# Patient Record
Sex: Female | Born: 1980 | Race: White | Hispanic: No | Marital: Single | Smoking: Former smoker
Health system: Southern US, Community
[De-identification: ages and names within clinical notes are randomized; demographics above are authoritative.]

## PROBLEM LIST (undated history)

## (undated) DIAGNOSIS — F909 Attention-deficit hyperactivity disorder, unspecified type: Secondary | ICD-10-CM

## (undated) DIAGNOSIS — D4709 Other mast cell neoplasms of uncertain behavior: Secondary | ICD-10-CM

## (undated) HISTORY — DX: Attention-deficit hyperactivity disorder, unspecified type: F90.9

## (undated) HISTORY — DX: Other mast cell neoplasms of uncertain behavior: D47.09

## (undated) HISTORY — PX: NO PAST SURGERIES: SHX2092

---

## 2001-03-31 ENCOUNTER — Other Ambulatory Visit: Admission: RE | Admit: 2001-03-31 | Discharge: 2001-03-31 | Payer: Self-pay | Admitting: Gynecology

## 2004-01-16 ENCOUNTER — Other Ambulatory Visit: Admission: RE | Admit: 2004-01-16 | Discharge: 2004-01-16 | Payer: Self-pay | Admitting: Gynecology

## 2007-02-03 ENCOUNTER — Other Ambulatory Visit: Admission: RE | Admit: 2007-02-03 | Discharge: 2007-02-03 | Payer: Self-pay | Admitting: Gynecology

## 2015-08-10 LAB — OB RESULTS CONSOLE HIV ANTIBODY (ROUTINE TESTING): HIV: NONREACTIVE

## 2015-08-10 LAB — OB RESULTS CONSOLE ABO/RH: RH Type: POSITIVE

## 2015-08-10 LAB — OB RESULTS CONSOLE RUBELLA ANTIBODY, IGM: Rubella: IMMUNE

## 2015-08-10 LAB — OB RESULTS CONSOLE ANTIBODY SCREEN: Antibody Screen: NEGATIVE

## 2015-08-10 LAB — OB RESULTS CONSOLE RPR: RPR: NONREACTIVE

## 2015-08-10 LAB — OB RESULTS CONSOLE HEPATITIS B SURFACE ANTIGEN: HEP B S AG: NEGATIVE

## 2015-08-10 LAB — OB RESULTS CONSOLE GC/CHLAMYDIA
CHLAMYDIA, DNA PROBE: NEGATIVE
GC PROBE AMP, GENITAL: NEGATIVE

## 2015-10-01 NOTE — L&D Delivery Note (Addendum)
Pt pushed for two hours then labored down for 60mins and begun pushing again. Pt pushed for about 15-20 mins to deliver a viable female infant in OA position over 1st degree mediolateral perineal laceration and left periurethral abrasion. Nuchal x 1 reduced over infants head at perineum - tight.  Anterior and posterior shoulders spontaneously delivered with next two pushes; body easily followed next. Infant placed on mothers abdomen and bulb suction of mouth and nose performed. Cord was then clamped and cut by supportive friend. Cord blood obtained for banking. Baby had a vigorous spontaneous cry noted. Placenta then delivered about 5 mins later intact, 3VC shultz. Fundal massage performed and pitocin per protocol. Fundus firm. First degree lac repaired with 2-0 vicryl suture. Great hemostatic and cosmetic results. Abrasion did not need repair.  Mother and baby stable. Counts correct. Apgars 9 and 9

## 2015-12-26 ENCOUNTER — Inpatient Hospital Stay (HOSPITAL_COMMUNITY): Admission: AD | Admit: 2015-12-26 | Payer: Self-pay | Source: Ambulatory Visit | Admitting: Obstetrics and Gynecology

## 2016-01-18 LAB — OB RESULTS CONSOLE GBS: STREP GROUP B AG: POSITIVE

## 2016-02-12 ENCOUNTER — Telehealth (HOSPITAL_COMMUNITY): Payer: Self-pay | Admitting: *Deleted

## 2016-02-12 ENCOUNTER — Encounter (HOSPITAL_COMMUNITY): Payer: Self-pay | Admitting: *Deleted

## 2016-02-12 NOTE — Telephone Encounter (Signed)
Preadmission screen  

## 2016-02-16 ENCOUNTER — Encounter (HOSPITAL_COMMUNITY): Payer: Self-pay | Admitting: *Deleted

## 2016-02-16 ENCOUNTER — Telehealth (HOSPITAL_COMMUNITY): Payer: Self-pay | Admitting: *Deleted

## 2016-02-16 NOTE — Telephone Encounter (Signed)
Preadmission screen  

## 2016-02-20 ENCOUNTER — Inpatient Hospital Stay (HOSPITAL_COMMUNITY): Payer: BC Managed Care – PPO | Admitting: Anesthesiology

## 2016-02-20 ENCOUNTER — Encounter (HOSPITAL_COMMUNITY): Payer: Self-pay

## 2016-02-20 ENCOUNTER — Inpatient Hospital Stay (HOSPITAL_COMMUNITY)
Admission: RE | Admit: 2016-02-20 | Discharge: 2016-02-23 | DRG: 775 | Disposition: A | Discharge status: Home / Self Care | Payer: BC Managed Care – PPO | Source: Ambulatory Visit | Attending: Obstetrics and Gynecology | Admitting: Obstetrics and Gynecology

## 2016-02-20 DIAGNOSIS — Z87891 Personal history of nicotine dependence: Secondary | ICD-10-CM | POA: Diagnosis not present

## 2016-02-20 DIAGNOSIS — F909 Attention-deficit hyperactivity disorder, unspecified type: Secondary | ICD-10-CM | POA: Diagnosis present

## 2016-02-20 DIAGNOSIS — O480 Post-term pregnancy: Secondary | ICD-10-CM | POA: Diagnosis present

## 2016-02-20 DIAGNOSIS — Z3A40 40 weeks gestation of pregnancy: Secondary | ICD-10-CM

## 2016-02-20 DIAGNOSIS — Z8261 Family history of arthritis: Secondary | ICD-10-CM | POA: Diagnosis not present

## 2016-02-20 DIAGNOSIS — O99824 Streptococcus B carrier state complicating childbirth: Secondary | ICD-10-CM | POA: Diagnosis present

## 2016-02-20 DIAGNOSIS — O99344 Other mental disorders complicating childbirth: Secondary | ICD-10-CM | POA: Diagnosis present

## 2016-02-20 DIAGNOSIS — Z3490 Encounter for supervision of normal pregnancy, unspecified, unspecified trimester: Secondary | ICD-10-CM

## 2016-02-20 LAB — TYPE AND SCREEN
ABO/RH(D): O POS
ANTIBODY SCREEN: NEGATIVE

## 2016-02-20 LAB — ABO/RH: ABO/RH(D): O POS

## 2016-02-20 LAB — RPR: RPR Ser Ql: NONREACTIVE

## 2016-02-20 LAB — CBC
HEMATOCRIT: 38.5 % (ref 36.0–46.0)
HEMOGLOBIN: 12.9 g/dL (ref 12.0–15.0)
MCH: 31.5 pg (ref 26.0–34.0)
MCHC: 33.5 g/dL (ref 30.0–36.0)
MCV: 93.9 fL (ref 78.0–100.0)
Platelets: 232 10*3/uL (ref 150–400)
RBC: 4.1 MIL/uL (ref 3.87–5.11)
RDW: 14.2 % (ref 11.5–15.5)
WBC: 17.5 10*3/uL — ABNORMAL HIGH (ref 4.0–10.5)

## 2016-02-20 MED ORDER — MISOPROSTOL 25 MCG QUARTER TABLET
25.0000 ug | ORAL_TABLET | ORAL | Status: DC | PRN
  Administered 2016-02-20 (×3): 25 ug via VAGINAL
  Filled 2016-02-20 (×3): qty 0.25
  Filled 2016-02-20: qty 1

## 2016-02-20 MED ORDER — OXYCODONE-ACETAMINOPHEN 5-325 MG PO TABS: 2.0000 | ORAL_TABLET | ORAL | Status: DC | PRN

## 2016-02-20 MED ORDER — PHENYLEPHRINE 40 MCG/ML (10ML) SYRINGE FOR IV PUSH (FOR BLOOD PRESSURE SUPPORT)
80.0000 ug | PREFILLED_SYRINGE | INTRAVENOUS | Status: DC | PRN
  Filled 2016-02-20: qty 5
  Filled 2016-02-20: qty 10

## 2016-02-20 MED ORDER — LACTATED RINGERS IV SOLN
INTRAVENOUS | Status: DC
  Administered 2016-02-20 – 2016-02-21 (×5): via INTRAVENOUS

## 2016-02-20 MED ORDER — OXYTOCIN BOLUS FROM INFUSION: 500.0000 mL | INTRAVENOUS | Status: DC

## 2016-02-20 MED ORDER — PENICILLIN G POTASSIUM 5000000 UNITS IJ SOLR
5.0000 10*6.[IU] | Freq: Once | INTRAVENOUS | Status: AC
  Administered 2016-02-20: 5 10*6.[IU] via INTRAVENOUS
  Filled 2016-02-20: qty 5

## 2016-02-20 MED ORDER — NALBUPHINE HCL 10 MG/ML IJ SOLN
10.0000 mg | INTRAMUSCULAR | Status: DC | PRN
  Administered 2016-02-20: 10 mg via INTRAVENOUS
  Filled 2016-02-20: qty 1

## 2016-02-20 MED ORDER — OXYCODONE-ACETAMINOPHEN 5-325 MG PO TABS: 1.0000 | ORAL_TABLET | ORAL | Status: DC | PRN

## 2016-02-20 MED ORDER — LACTATED RINGERS IV SOLN: 500.0000 mL | INTRAVENOUS | Status: DC | PRN

## 2016-02-20 MED ORDER — NALBUPHINE HCL 10 MG/ML IJ SOLN: 5.0000 mg | INTRAMUSCULAR | Status: DC | PRN

## 2016-02-20 MED ORDER — EPHEDRINE 5 MG/ML INJ
10.0000 mg | INTRAVENOUS | Status: DC | PRN
  Filled 2016-02-20: qty 2

## 2016-02-20 MED ORDER — CITRIC ACID-SODIUM CITRATE 334-500 MG/5ML PO SOLN
30.0000 mL | ORAL | Status: DC | PRN
Start: 2016-02-20 — End: 2016-02-21

## 2016-02-20 MED ORDER — DIPHENHYDRAMINE HCL 50 MG/ML IJ SOLN: 12.5000 mg | INTRAMUSCULAR | Status: DC | PRN

## 2016-02-20 MED ORDER — FENTANYL 2.5 MCG/ML BUPIVACAINE 1/10 % EPIDURAL INFUSION (WH - ANES)
14.0000 mL/h | INTRAMUSCULAR | Status: DC | PRN
  Administered 2016-02-20 – 2016-02-21 (×2): 14 mL/h via EPIDURAL
  Filled 2016-02-20 (×2): qty 125

## 2016-02-20 MED ORDER — LIDOCAINE-EPINEPHRINE (PF) 2 %-1:200000 IJ SOLN
INTRAMUSCULAR | Status: DC | PRN
  Administered 2016-02-20: 5 mL

## 2016-02-20 MED ORDER — PHENYLEPHRINE 40 MCG/ML (10ML) SYRINGE FOR IV PUSH (FOR BLOOD PRESSURE SUPPORT)
80.0000 ug | PREFILLED_SYRINGE | INTRAVENOUS | Status: DC | PRN
  Filled 2016-02-20: qty 5

## 2016-02-20 MED ORDER — OXYTOCIN 40 UNITS IN LACTATED RINGERS INFUSION - SIMPLE MED: 1.0000 m[IU]/min | INTRAVENOUS | Status: DC

## 2016-02-20 MED ORDER — TERBUTALINE SULFATE 1 MG/ML IJ SOLN
0.2500 mg | Freq: Once | INTRAMUSCULAR | Status: DC | PRN
  Filled 2016-02-20: qty 1

## 2016-02-20 MED ORDER — OXYTOCIN 40 UNITS IN LACTATED RINGERS INFUSION - SIMPLE MED
1.0000 m[IU]/min | INTRAVENOUS | Status: DC
  Administered 2016-02-20: 2 m[IU]/min via INTRAVENOUS
  Filled 2016-02-20: qty 1000

## 2016-02-20 MED ORDER — PENICILLIN G POTASSIUM 5000000 UNITS IJ SOLR
2.5000 10*6.[IU] | INTRAMUSCULAR | Status: DC
  Administered 2016-02-20 (×5): 2.5 10*6.[IU] via INTRAVENOUS
  Filled 2016-02-20 (×9): qty 2.5

## 2016-02-20 MED ORDER — LIDOCAINE HCL (PF) 1 % IJ SOLN
30.0000 mL | INTRAMUSCULAR | Status: DC | PRN
  Filled 2016-02-20: qty 30

## 2016-02-20 MED ORDER — OXYTOCIN 40 UNITS IN LACTATED RINGERS INFUSION - SIMPLE MED: 2.5000 [IU]/h | INTRAVENOUS | Status: DC

## 2016-02-20 MED ORDER — ONDANSETRON HCL 4 MG/2ML IJ SOLN
4.0000 mg | Freq: Four times a day (QID) | INTRAMUSCULAR | Status: DC | PRN
Start: 2016-02-20 — End: 2016-02-21

## 2016-02-20 MED ORDER — BUPIVACAINE HCL (PF) 0.25 % IJ SOLN
INTRAMUSCULAR | Status: DC | PRN
  Administered 2016-02-20 (×2): 4 mL via EPIDURAL

## 2016-02-20 MED ORDER — ACETAMINOPHEN 325 MG PO TABS: 650.0000 mg | ORAL_TABLET | ORAL | Status: DC | PRN

## 2016-02-20 MED ORDER — LACTATED RINGERS IV SOLN: 500.0000 mL | Freq: Once | INTRAVENOUS | Status: DC

## 2016-02-20 NOTE — Anesthesia Preprocedure Evaluation (Signed)
Anesthesia Evaluation  Patient identified by MRN, date of birth, ID band Patient awake    Reviewed: Allergy & Precautions, NPO status , Patient's Chart, lab work & pertinent test results  History of Anesthesia Complications Negative for: history of anesthetic complications  Airway Mallampati: II  TM Distance: >3 FB Neck ROM: Full    Dental  (+) Teeth Intact   Pulmonary former smoker,    breath sounds clear to auscultation       Cardiovascular negative cardio ROS   Rhythm:Regular     Neuro/Psych negative neurological ROS  negative psych ROS   GI/Hepatic negative GI ROS, Neg liver ROS,   Endo/Other  negative endocrine ROS  Renal/GU negative Renal ROS     Musculoskeletal   Abdominal   Peds  Hematology negative hematology ROS (+)   Anesthesia Other Findings Mastocytosis  Reproductive/Obstetrics (+) Pregnancy                             Anesthesia Physical Anesthesia Plan  ASA: II  Anesthesia Plan: Epidural   Post-op Pain Management:    Induction:   Airway Management Planned:   Additional Equipment:   Intra-op Plan:   Post-operative Plan:   Informed Consent: I have reviewed the patients History and Physical, chart, labs and discussed the procedure including the risks, benefits and alternatives for the proposed anesthesia with the patient or authorized representative who has indicated his/her understanding and acceptance.     Plan Discussed with: Anesthesiologist  Anesthesia Plan Comments:         Anesthesia Quick Evaluation

## 2016-02-20 NOTE — Progress Notes (Signed)
Patient ID: Annette Gonzalez, female   DOB: [birth year], 35 y.o.   MRN: [id number] Pt reports nubain helping with pain during contractions but still appreciating moderate pain. Considering epidural.  VSS EFM- 150, +accels, -decels, moderate variability, cat 1 TOCO - contractions q 2-9mins SVE 4.5/80/-2  A/P: Progressing well in labor on pitocin        Pain control prn        Anticipate svd

## 2016-02-20 NOTE — Anesthesia Procedure Notes (Signed)
Epidural Patient location during procedure: OB  Staffing Anesthesiologist: Geordan Xu Performed by: anesthesiologist   Preanesthetic Checklist Completed: patient identified, surgical consent, pre-op evaluation, timeout performed, IV checked, risks and benefits discussed and monitors and equipment checked  Epidural Patient position: sitting Prep: DuraPrep Patient monitoring: heart rate, cardiac monitor, continuous pulse ox and blood pressure Approach: midline Location: L4-L5 Injection technique: LOR saline  Needle:  Needle type: Tuohy  Needle gauge: 17 G Needle length: 9 cm Needle insertion depth: 8 cm Catheter type: closed end flexible Catheter size: 19 Gauge Catheter at skin depth: 14 cm Test dose: negative and 2% lidocaine with Epi 1:200 K  Assessment Events: blood not aspirated, injection not painful, no injection resistance, negative IV test and no paresthesia  Additional Notes Reason for block:procedure for pain

## 2016-02-20 NOTE — H&P (Signed)
Annette Gonzalez is a 35 y.o. female presenting for scheduled iol for postdates. Pt has had a relatively benign prenatal course that begun in first trimester. Dating is based on a first trimester u/s. Low risk female for panorama results, passed glucose screening. She is GBS positive. Pt denies any painful regular contractions, VB or LOF. She is appreciating FMs.   Maternal Medical History:  Reason for admission: Nausea. Postdates iol  Contractions: Frequency: rare.   Perceived severity is mild.    Fetal activity: Perceived fetal activity is normal.   Last perceived fetal movement was within the past hour.    Prenatal complications: no prenatal complications Prenatal Complications - Diabetes: none.    OB History    Gravida Para Term Preterm AB TAB SAB Ectopic Multiple Living   2    1          Past Medical History  Diagnosis Date  . Mastocytosis     non systemic  . ADHD (attention deficit hyperactivity disorder)    Past Surgical History  Procedure Laterality Date  . No past surgeries     Family History: family history includes Arthritis in her mother; Cancer in her father and mother; Hyperlipidemia in her father; Learning disabilities in her brother; Scoliosis in her mother. There is no history of Alcohol abuse, Asthma, Birth defects, COPD, Depression, Diabetes, Drug abuse, Early death, Hearing loss, Heart disease, Hypertension, Kidney disease, Mental illness, Mental retardation, Miscarriages / Stillbirths, Stroke, Vision loss, or Varicose Veins. Social History:  reports that she quit smoking about 7 months ago. She has never used smokeless tobacco. She reports that she does not drink alcohol or use illicit drugs.   Prenatal Transfer Tool  Maternal Diabetes: No Genetic Screening: Normal Maternal Ultrasounds/Referrals: Normal Fetal Ultrasounds or other Referrals:  None Maternal Substance Abuse:  No Significant Maternal Medications:  None Significant Maternal Lab Results:  Lab  values include: Group B Strep positive Other Comments:  None  Review of Systems  Constitutional: Positive for malaise/fatigue. Negative for fever and chills.  Eyes: Negative for blurred vision and double vision.  Respiratory: Negative for shortness of breath.   Cardiovascular: Negative for chest pain.  Gastrointestinal: Positive for abdominal pain. Negative for heartburn, nausea and vomiting.  Musculoskeletal: Positive for back pain.  Skin: Negative for itching and rash.  Neurological: Negative for dizziness and headaches.  Psychiatric/Behavioral: Negative for depression, hallucinations and substance abuse. The patient is nervous/anxious.     Dilation: 1.5 Effacement (%): 40 Station: -2 Exam by:: [place] Stalling RN Blood pressure 122/84, pulse 83, temperature 98.1 F (36.7 C), temperature source Oral, resp. rate 18, height 5\' 10"  (1.778 m), weight 260 lb (117.935 kg), last menstrual period 05/26/2015. Maternal Exam:  Uterine Assessment: Contraction strength is mild.  Contraction frequency is rare.   Abdomen: Patient reports generalized tenderness.  Estimated fetal weight is AGA.   Fetal presentation: vertex  Introitus: Normal vulva. Normal vagina.  Pelvis: adequate for delivery.   Cervix: Cervix evaluated by digital exam.     Physical Exam  Constitutional: She is oriented to person, place, and time. She appears well-developed and well-nourished.  HENT:  Head: Normocephalic.  Neck: Normal range of motion.  Respiratory: Effort normal.  GI: Soft. There is generalized tenderness.  Genitourinary: Vagina normal and uterus normal.  Musculoskeletal: Normal range of motion.  Neurological: She is alert and oriented to person, place, and time.  Skin: Skin is warm.  Psychiatric: She has a normal mood and affect. Her behavior is  normal. Judgment and thought content normal.    Prenatal labs: ABO, Rh: --/--/O POS, O POS (05/23 0104) Antibody: NEG (05/23 0104) Rubella: Immune (11/10  0000) RPR: Nonreactive (11/10 0000)  HBsAg: Negative (11/10 0000)  HIV: Non-reactive (11/10 0000)  GBS: Positive (04/20 0000)   Assessment/Plan: G2P0010 at 48 3/[redacted]wks gestation for postdates iol S/p two doses of cytotec  Recheck at 07-1129 and either place third dose or switch to pitocin based on exam Pain control prn GBS treatment when in active labor  Anticipate svd   Bonnee Quin Jennett Tarbell 02/20/2016, 8:53 AM

## 2016-02-20 NOTE — Anesthesia Pain Management Evaluation Note (Signed)
  CRNA Pain Management Visit Note  Patient: Annette Gonzalez, 35 y.o., female  "Hello I am a member of the anesthesia team at [facility]. We have an anesthesia team available at all times to provide care throughout the hospital, including epidural management and anesthesia for C-section. I don't know your plan for the delivery whether it a natural birth, water birth, IV sedation, nitrous supplementation, doula or epidural, but we want to meet your pain goals."   1.Was your pain managed to your expectations on prior hospitalizations?   No   2.What is your expectation for pain management during this hospitalization?     Epidural  3.How can we help you reach that goal? Epidural  Record the patient's initial score and the patient's pain goal.   Pain: 0  Pain Goal: 5 [facility] wants you to be able to say your pain was always managed very well.  Casimer Lanius 02/20/2016

## 2016-02-20 NOTE — Progress Notes (Signed)
Patient ID: Annette Gonzalez, female   DOB: 26-Sep-1981, 35 y.o.   MRN: [id number] Pt doing well with no complaints. +Fms, no VB or LOF sve - unchanged  Plan - will place another dose of cytotec and recheck in 4 hours           Likely pitocin to follow           Arom when able

## 2016-02-20 NOTE — Progress Notes (Signed)
Annette Gonzalez is a 35 y.o. G2P0010 at [redacted]w[redacted]d by ultrasound admitted for induction of labor due to Post dates. Due date 02/17/16.  Subjective: Pt comfortable with epidural. Turned on left then right then to center after iupc and fse placed for better tracing. Pitocin was then turned off and o2 applied via  Mask with good resolution of strip. Contractions spaced out from q 1-15mins to q 13mins.   Objective: BP 127/75 mmHg  Pulse 87  Temp(Src) 98.3 F (36.8 C) (Oral)  Resp 18  Ht 5\' 10"  (1.778 m)  Wt 260 lb (117.935 kg)  BMI 37.31 kg/m2  SpO2 97%  LMP 05/26/2015      FHT:  FHR: 125 bpm, variability: moderate,  accelerations:  Present,  decelerations:  Absent cat 1 UC:   regular, every 3 minutes; mvus inadequate = 105 SVE:   Dilation: 6 Effacement (%): 90 Station: -1 Exam by:: dr, Annette Gonzalez  Labs: Lab Results  Component Value Date   WBC 17.5* 02/20/2016   HGB 12.9 02/20/2016   HCT 38.5 02/20/2016   MCV 93.9 02/20/2016   PLT 232 02/20/2016    Assessment / Plan: Induction of labor due to postterm,  progressing well on pitocin  Labor: Progressing normally; restart pitocin at 1/30mu ( was up to 62mus before switched off) Preeclampsia:  n/a Fetal Wellbeing:  Category I Pain Control:  Epidural I/D:  n/a Anticipated MOD:  NSVD  Annette Gonzalez 02/20/2016, 11:14 PM

## 2016-02-20 NOTE — Progress Notes (Signed)
Patient ID: Annette Gonzalez, female   DOB: 12-05-1980, 35 y.o.   MRN: [id number] Pt reports appreciating some contractions but tolerating well. S/p one dose of nubain. No VB or LOF. +Fms VSS  EFM- 145, +accels, -decels, moderate variability, cat 1 TOCO - contractions q 3-25mins SVE 2-3/75/-2, ant  A/P: Progressing in labor on pitocin         S/p cytotec x 3         AROM performed with small return of clear amniotic fluid         Pain control prn         Anticipate svd

## 2016-02-21 ENCOUNTER — Encounter (HOSPITAL_COMMUNITY): Payer: Self-pay

## 2016-02-21 MED ORDER — SENNOSIDES-DOCUSATE SODIUM 8.6-50 MG PO TABS
2.0000 | ORAL_TABLET | ORAL | Status: DC
  Administered 2016-02-21 – 2016-02-23 (×2): 2 via ORAL
  Filled 2016-02-21 (×2): qty 2

## 2016-02-21 MED ORDER — ONDANSETRON HCL 4 MG/2ML IJ SOLN: 4.0000 mg | INTRAMUSCULAR | Status: DC | PRN

## 2016-02-21 MED ORDER — COCONUT OIL OIL
1.0000 "application " | TOPICAL_OIL | Status: DC | PRN
  Administered 2016-02-22: 1 via TOPICAL
  Filled 2016-02-21: qty 120

## 2016-02-21 MED ORDER — PRENATAL MULTIVITAMIN CH
1.0000 | ORAL_TABLET | Freq: Every day | ORAL | Status: DC
  Administered 2016-02-21 – 2016-02-23 (×3): 1 via ORAL
  Filled 2016-02-21 (×3): qty 1

## 2016-02-21 MED ORDER — ACETAMINOPHEN 325 MG PO TABS
650.0000 mg | ORAL_TABLET | ORAL | Status: DC | PRN
  Administered 2016-02-22: 650 mg via ORAL
  Filled 2016-02-21: qty 2

## 2016-02-21 MED ORDER — BENZOCAINE-MENTHOL 20-0.5 % EX AERO
1.0000 "application " | INHALATION_SPRAY | CUTANEOUS | Status: DC | PRN
  Filled 2016-02-21: qty 56

## 2016-02-21 MED ORDER — ONDANSETRON HCL 4 MG PO TABS: 4.0000 mg | ORAL_TABLET | ORAL | Status: DC | PRN

## 2016-02-21 MED ORDER — DIBUCAINE 1 % RE OINT: 1.0000 "application " | TOPICAL_OINTMENT | RECTAL | Status: DC | PRN

## 2016-02-21 MED ORDER — IBUPROFEN 600 MG PO TABS
600.0000 mg | ORAL_TABLET | Freq: Four times a day (QID) | ORAL | Status: DC
  Administered 2016-02-21 – 2016-02-23 (×9): 600 mg via ORAL
  Filled 2016-02-21 (×9): qty 1

## 2016-02-21 MED ORDER — DIPHENHYDRAMINE HCL 25 MG PO CAPS: 25.0000 mg | ORAL_CAPSULE | Freq: Four times a day (QID) | ORAL | Status: DC | PRN

## 2016-02-21 MED ORDER — TETANUS-DIPHTH-ACELL PERTUSSIS 5-2.5-18.5 LF-MCG/0.5 IM SUSP: 0.5000 mL | Freq: Once | INTRAMUSCULAR | Status: DC

## 2016-02-21 MED ORDER — ZOLPIDEM TARTRATE 5 MG PO TABS: 5.0000 mg | ORAL_TABLET | Freq: Every evening | ORAL | Status: DC | PRN

## 2016-02-21 MED ORDER — WITCH HAZEL-GLYCERIN EX PADS: 1.0000 "application " | MEDICATED_PAD | CUTANEOUS | Status: DC | PRN

## 2016-02-21 MED ORDER — SIMETHICONE 80 MG PO CHEW: 80.0000 mg | CHEWABLE_TABLET | ORAL | Status: DC | PRN

## 2016-02-21 NOTE — Progress Notes (Signed)
Patient ID: Annette Gonzalez, female   DOB: 03-Sep-1981, 35 y.o.   MRN: [id number] Pt c/o one spot on lower abdomen where feels pain with contractions Early and rare late decels noted with contraction hence sve done. Cervix 9/100/-1  A/P: Epidural buton pushed         Peanut ball placed to help descent         Recheck in 1-2hours or prn         Anticipate svd

## 2016-02-21 NOTE — Progress Notes (Signed)
CSW received consult for hx of anxiety.  CSW completed chart review and notes no mental health concerns noted during prenatal course.  Due to limited staffing, CSW is unable to meet with MOB at this time.  Please call CSW if acute concerns are noted.

## 2016-02-21 NOTE — Anesthesia Postprocedure Evaluation (Signed)
Anesthesia Post Note  Patient: Annette Gonzalez  Procedure(s) Performed: * No procedures listed *  Patient location during evaluation: Mother Baby Anesthesia Type: Epidural Level of consciousness: awake and alert Pain management: pain level controlled Vital Signs Assessment: post-procedure vital signs reviewed and stable Respiratory status: spontaneous breathing, nonlabored ventilation and respiratory function stable Cardiovascular status: stable Postop Assessment: no headache, no backache and epidural receding Anesthetic complications: no     Last Vitals:  Filed Vitals:   02/21/16 0616 02/21/16 0815  BP: 120/65 114/62  Pulse: 91 81  Temp:  36.8 C  Resp: 18 18    Last Pain:  Filed Vitals:   02/21/16 1035  PainSc: 2    Pain Goal:                 Annette Gonzalez

## 2016-02-21 NOTE — Lactation Note (Signed)
This note was copied from a baby's chart. Lactation Consultation Note Initial visit at 4 hours of age.  Mom reports 2 feedings early this morning and then baby has been sleepy.  Mom is aware to work on a wide latch with depth.  Mom is concerned about her large nipples.  Mom has large breast with right areola semi compressible with a large short nipple that flattens with compression.  Reverse pressure demonstrated.  Left nipple more compressible and nipple flattens with compression.  Instructed on use of hand pump with #27 flange and discuss maybe needing to increase flange size later.  Instructed on inverted shell use with nipple barely fitting in center hole.  Advised mom to use with bra and check after a few minutes to make sure nipple is WNL.  Assisted with football hold on right breast, baby gaggy with clear mucus and not interested in latching.  Discussed positioning with mom and baby on chest STS.  Mom to call RN for assist with positioning until she is more comfortable.  [facility] LC resources given and discussed.  Encouraged to feed with early cues on demand.  Early newborn behavior discussed.  Hand expression demonstrated with colostrum visible.  Mom to call for assist as needed.    Patient Name: Annette Gonzalez Today's Date: 02/21/2016 Reason for consult: Initial assessment   Maternal Data Has patient been taught Hand Expression?: Yes Does the patient have breastfeeding experience prior to this delivery?: No  Feeding Feeding Type: Breast Fed  LATCH Score/Interventions Latch: Too sleepy or reluctant, no latch achieved, no sucking elicited.  Audible Swallowing: None  Type of Nipple: Flat Intervention(s): Hand pump;Shells  Comfort (Breast/Nipple): Soft / non-tender     Hold (Positioning): Assistance needed to correctly position infant at breast and maintain latch.  LATCH Score: 4  Lactation Tools Discussed/Used Tools: Shells;Pump Shell Type: Inverted Breast pump type:  Manual Pump Review: Milk Storage   Consult Status Consult Status: Follow-up Date: 02/22/16 Follow-up type: In-patient    Kendell Bane Justine Null 02/21/2016, 5:23 PM

## 2016-02-22 LAB — CBC
HEMATOCRIT: 35.9 % — AB (ref 36.0–46.0)
Hemoglobin: 11.9 g/dL — ABNORMAL LOW (ref 12.0–15.0)
MCH: 30.9 pg (ref 26.0–34.0)
MCHC: 33.1 g/dL (ref 30.0–36.0)
MCV: 93.2 fL (ref 78.0–100.0)
Platelets: 207 10*3/uL (ref 150–400)
RBC: 3.85 MIL/uL — AB (ref 3.87–5.11)
RDW: 14.3 % (ref 11.5–15.5)
WBC: 17.1 10*3/uL — AB (ref 4.0–10.5)

## 2016-02-22 LAB — CCBB MATERNAL DONOR DRAW

## 2016-02-22 NOTE — Progress Notes (Addendum)
Patient ID: Annette Gonzalez, female   DOB: 12/19/1980, 35 y.o.   MRN: [id number]  Phone call from nurse, pt desires discharge D/w pt - will d/c with motrin and PNV F/u 6 weeks  Pt desires to stay after talking with lactation consult

## 2016-02-22 NOTE — Progress Notes (Signed)
Post Partum Day 1 Subjective: no complaints, up ad lib, voiding, tolerating PO, + flatus and bonding well with baby. Breastfeeding. Deneis any lightheadedness, fever or chills. fells well  Objective: Blood pressure 100/62, pulse 79, temperature 98.5 F (36.9 C), temperature source Oral, resp. rate 18, height 5\' 10"  (1.778 m), weight 260 lb (117.935 kg), last menstrual period 05/26/2015, SpO2 98 %, unknown if currently breastfeeding.  Physical Exam:  General: alert, cooperative and no distress Lochia: appropriate Uterine Fundus: firm Incision: n/a DVT Evaluation: No evidence of DVT seen on physical exam. No significant calf/ankle edema.   Recent Labs  02/20/16 0104 02/22/16 0507  HGB 12.9 11.9*  HCT 38.5 35.9*    Assessment/Plan: Plan for discharge tomorrow, Breastfeeding and Circumcision prior to discharge   LOS: 2 days   [place] 02/22/2016, 8:05 AM

## 2016-02-22 NOTE — Lactation Note (Signed)
This note was copied from a baby's chart. Lactation Consultation Note  Patient Name: Boy Suszanne Brakefield [id number] Date: 02/22/2016 Reason for consult: Follow-up assessment   Follow up with mom of 57 hour old infant. Infant with 2 BF for 10-15 minutes, 4 attempts, EBM x 2 of 2 and 5 cc, 3 voids and 3 stools in last 24 hours. LATCH Scores 4-8 by bedside RN's.  Mom reports infant has been sleepy today, he was circumcised this morning. Infant awake and cueing to feed. Assisted mom in latching infant to breast at [facility] request. Mom with large breasts and large diameter nipples that are everted at rest and flatten completely with compression. Left nipple is smaller than right. Mom to use #30 flange to left and #27 flange to right nipple. Areola tissue is thick and not completely compressible. Mom with shells in the room, that she is not wearing. Mom is using manual pump to pull nipple out and get colostrum prior to latching infant. Infant was not able to latch to the nipple, he was noted to be tongue sucking vs actually BF. Placed a #24 NS to nipple. Infant latched to NS easily and nursed for about 15 minutes needing some stimulation to maintain suckling. No drop of EBM was noted in NS after feeding, he did pull nipple into NS. Infant was then fussy and mom was able to attach nipple shield and latch infant to left breast independently.   DEBP set up with instructions for set up, assembling, disassembling, milk storage, and pumping. Mom pumped for 15 minutes with DEBP on Initiate setting. A few gtts were noted in flange to left nipple.   Discussed with mom how to give infant supplement of EBM via NS, syringe, or spoon. After working with mom, she reported she would like to stay another night to work on feeding. Called Dr. Doloris Hall who agreed for infant to stay another night. Roosvelt Maser RN took orders for d/c to be cancelled.   Plan: BF infant 8-12 x in 24 hours at first feeding cues using #24 NS Prime NS  with EBM if available Pump for 15 minutes on Initiate setting with DEBP 6-8 x a day Hand express post pumping Feed all EBM back to infant via spoon/syringe  Mom has pump rental paperwork in room to rent a pump for 2 weeks prior to receiving insurance pump. OP appt scheduled for Friday June 2 @ 1300. Appointment reminder given.   Reviewed all BF information reviewed in Taking Care of Baby and Me Booklet. Reviewed engorgement prevention/treatment with mom. Reviewed I/O. Enc mom to maintain feeding log and take to Ped appt and LC appts.        Maternal Data Has patient been taught Hand Expression?: Yes Does the patient have breastfeeding experience prior to this delivery?: No  Feeding Feeding Type: Breast Fed Length of feed: 15 min  LATCH Score/Interventions Latch: Repeated attempts needed to sustain latch, nipple held in mouth throughout feeding, stimulation needed to elicit sucking reflex. Intervention(s): Skin to skin;Teach feeding cues;Waking techniques Intervention(s): Assist with latch;Breast massage;Breast compression;Adjust position  Audible Swallowing: A few with stimulation Intervention(s): Skin to skin;Hand expression;Alternate breast massage  Type of Nipple: Flat (Nipples flatten with compression) Intervention(s): Shells;Hand pump;Double electric pump  Comfort (Breast/Nipple): Soft / non-tender     Hold (Positioning): Assistance needed to correctly position infant at breast and maintain latch. Intervention(s): Breastfeeding basics reviewed;Support Pillows;Position options;Skin to skin  LATCH Score: 6  Lactation Tools Discussed/Used Tools: Nipple Shields Nipple shield  size: 24 Shell Type: Inverted Breast pump type: Double-Electric Breast Pump Pump Review: Setup, frequency, and cleaning;Milk Storage Initiated by:: Nonah Mattes, RN, IBCLC Date initiated:: 02/22/16   Consult Status Consult Status: Follow-up Date: 02/23/16 Follow-up type:  In-patient    Debby Freiberg Annaleigh Steinmeyer 02/22/2016, 4:55 PM

## 2016-02-23 MED ORDER — IBUPROFEN 600 MG PO TABS: 600.0000 mg | ORAL_TABLET | Freq: Four times a day (QID) | ORAL | Status: AC

## 2016-02-23 NOTE — Discharge Summary (Signed)
OB Discharge Summary     Patient Name: Annette Gonzalez DOB: April 15, 1981 MRN: [id number]  Date of admission: 02/20/2016 Delivering MD: Carlynn Purl [facility]   Date of discharge: 02/23/2016  Admitting diagnosis: INDUCTION Intrauterine pregnancy: [redacted]w[redacted]d     Secondary diagnosis:  Active Problems:   Term pregnancy   SVD (spontaneous vaginal delivery)   Postpartum care following vaginal delivery  Additional problems: none     Discharge diagnosis: Term Pregnancy Delivered                                                                                                Post partum procedures:none  Augmentation: AROM and Pitocin  Complications: None  Hospital course:  Induction of Labor With Vaginal Delivery   35 y.o. yo G2P1011 at [redacted]w[redacted]d was admitted to the hospital 02/20/2016 for induction of labor.  Indication for induction: Postdates.  Patient had an uncomplicated labor course as follows: Membrane Rupture Time/Date: 5:29 PM ,02/20/2016   Intrapartum Procedures: Episiotomy: None [1]                                         Lacerations:  1st degree [2];Perineal [11]  Patient had delivery of a Viable infant.  Information for the patient's newborn:  Brenae, Lafuente [id number]  Delivery Method: Vaginal, Spontaneous Delivery (Filed from Delivery Summary)   02/21/2016  Details of delivery can be found in separate delivery note.  Patient had a routine postpartum course. Patient is discharged home 02/23/2016.   Physical exam  Filed Vitals:   02/21/16 2200 02/22/16 0533 02/22/16 1859 02/23/16 0638  BP: 111/60 100/62 115/77 118/70  Pulse: 90 79 78 72  Temp: 98.3 F (36.8 C) 98.5 F (36.9 C) 98.3 F (36.8 C) 98.5 F (36.9 C)  TempSrc:  Oral Oral Oral  Resp:  18 18 18   Height:      Weight:      SpO2:       General: alert, cooperative and no distress Lochia: appropriate Uterine Fundus: firm Incision: N/A DVT Evaluation: No evidence of DVT seen on physical exam. No  significant calf/ankle edema. Labs: Lab Results  Component Value Date   WBC 17.1* 02/22/2016   HGB 11.9* 02/22/2016   HCT 35.9* 02/22/2016   MCV 93.2 02/22/2016   PLT 207 02/22/2016   No flowsheet data found.  Discharge instruction: per After Visit Summary and "Baby and Me Booklet".  After visit meds:    Medication List    TAKE these medications        ibuprofen 600 MG tablet  Commonly known as:  ADVIL,MOTRIN  Take 1 tablet (600 mg total) by mouth every 6 (six) hours.     prenatal multivitamin Tabs tablet  Take 1 tablet by mouth daily at 12 noon.        Diet: routine diet  Activity: Advance as tolerated. Pelvic rest for 6 weeks.   Outpatient follow up:6 weeks Follow up Appt:Future Appointments Date Time Provider [place]  03/01/2016 1:00  PM [place] [facility] CONSULTANT [place] None   Follow up Visit:No Follow-up on file.  Postpartum contraception: Undecided  Newborn Data: Live born female  Birth Weight: 8 lb 4.5 oz (3755 g) APGAR: 9, 9  Baby Feeding: Bottle and Breast Disposition:home with mother   02/23/2016 [place], DO

## 2016-02-23 NOTE — Lactation Note (Signed)
This note was copied from a baby's chart. Lactation Consultation Note  Patient Name: Annette Gonzalez Today's Date: 02/23/2016   Visited with Mom on day of discharge.  Baby 74 hrs old.  Mom states that feedings are going better now.  Baby latching and suckling for longer using a nipple shield.  Mom offering supplement, and pumping using the double pump.  Mom obtaining more colostrum with pumping.  Rented [place] for 2 weeks until she obtains a pump from insurance.  Mom has an OP lactation appointment for 03/01/16 @ 1pm.  Mom all packed up ready to go and denied needing any latching help.  To ask for help prn.    [name] 02/23/2016, 10:46 AM

## 2016-02-23 NOTE — Discharge Instructions (Signed)
Nothing in vagina for 6 weeks.  No sex, tampons, and douching.  Other instructions as in [facility] Discharge Booklet. °

## 2016-02-23 NOTE — Progress Notes (Signed)
Post Partum Day 2 Subjective: no complaints, up ad lib, voiding, tolerating PO, + flatus and bpnding well with baby  Objective: Blood pressure 118/70, pulse 72, temperature 98.5 F (36.9 C), temperature source Oral, resp. rate 18, height 5\' 10"  (1.778 m), weight 260 lb (117.935 kg), last menstrual period 05/26/2015, SpO2 98 %, unknown if currently breastfeeding.  Physical Exam:  General: alert, cooperative and no distress Lochia: appropriate Uterine Fundus: firm Incision: n/a DVT Evaluation: No evidence of DVT seen on physical exam. No significant calf/ankle edema.   Recent Labs  02/22/16 0507  HGB 11.9*  HCT 35.9*    Assessment/Plan: Discharge home, Breastfeeding, Circumcision prior to discharge and Contraception none   LOS: 3 days   [place] 02/23/2016, 10:06 AM

## 2016-03-01 ENCOUNTER — Inpatient Hospital Stay (HOSPITAL_COMMUNITY): Admit: 2016-03-01 | Payer: BC Managed Care – PPO

## 2016-09-26 ENCOUNTER — Other Ambulatory Visit: Payer: Self-pay | Admitting: Rheumatology

## 2016-09-26 DIAGNOSIS — M25562 Pain in left knee: Secondary | ICD-10-CM

## 2016-09-28 ENCOUNTER — Ambulatory Visit
Admission: RE | Admit: 2016-09-28 | Discharge: 2016-09-28 | Disposition: A | Discharge status: Home / Self Care | Payer: BC Managed Care – PPO | Source: Ambulatory Visit | Attending: Rheumatology | Admitting: Rheumatology

## 2016-09-28 DIAGNOSIS — M25562 Pain in left knee: Secondary | ICD-10-CM

## 2017-08-07 IMAGING — MR MR KNEE*L* W/O CM
4 of 5 series · 19 of 40 positions shown · non-contrast
Comparison: None.

CLINICAL DATA: Left knee pain.

EXAM:
MRI OF THE LEFT KNEE WITHOUT CONTRAST
TECHNIQUE: Multiplanar, multisequence MR imaging of the knee was performed. No
intravenous contrast was administered.

[Series 3: PD fat-sat · axial · 3.5mm · 0.31mm/px · z∈[-60,+41]mm · 8 of 25 slices shown (1 of 3)]
[im 1/25]
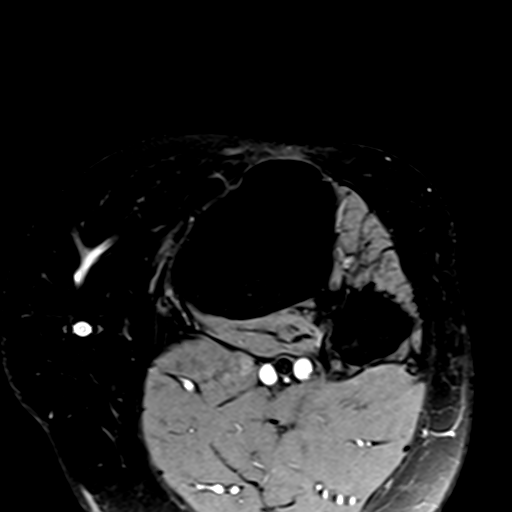
[im 4/25]
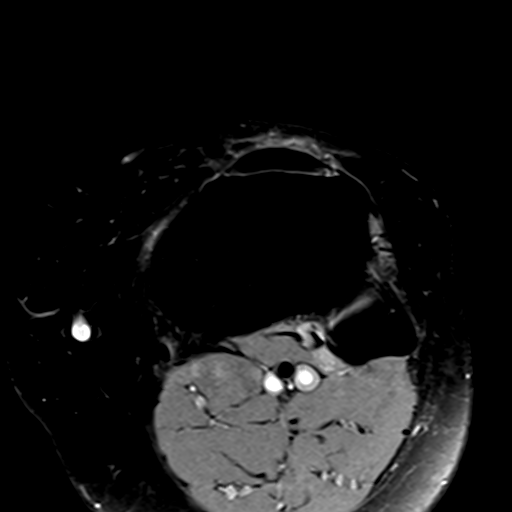
[im 7/25]
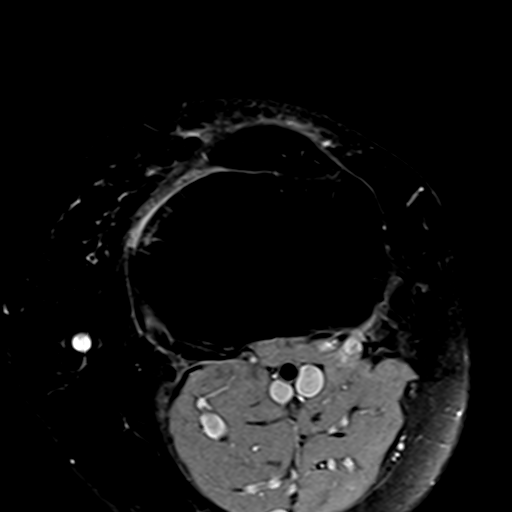
[im 11/25]
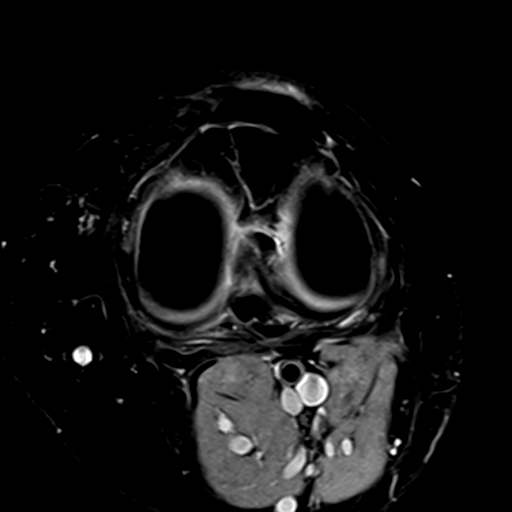
[im 14/25]
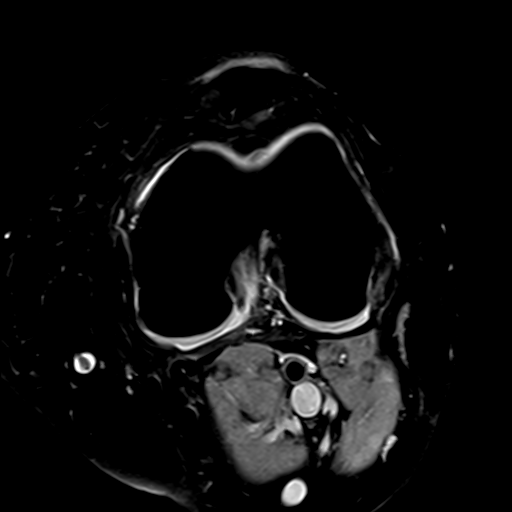
[im 18/25]
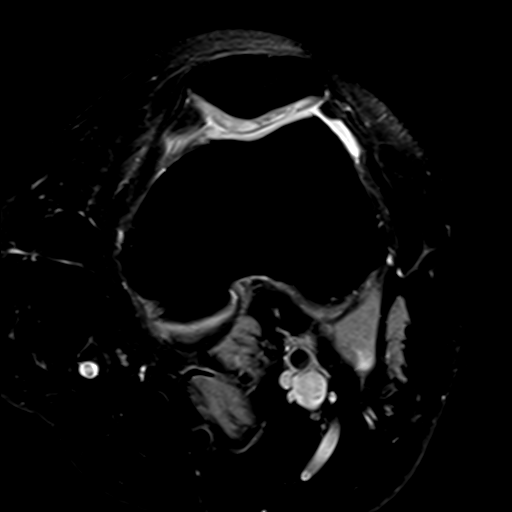
[im 21/25]
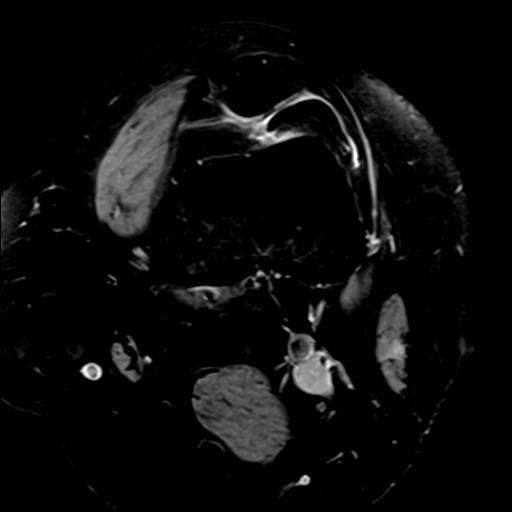
[im 25/25]
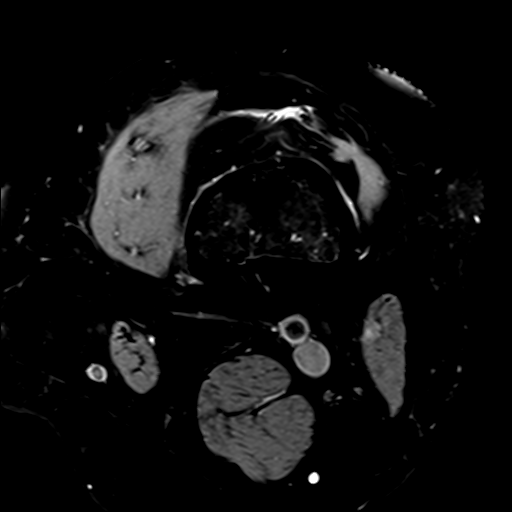

[Series 4: PD fat-sat · coronal · 3.5mm · 0.31mm/px · 5 of 27 slices shown (2 of 3)]
[im 1/27]
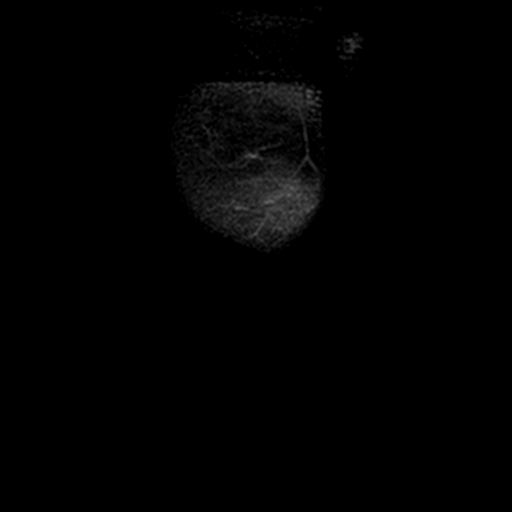
[im 4/27]
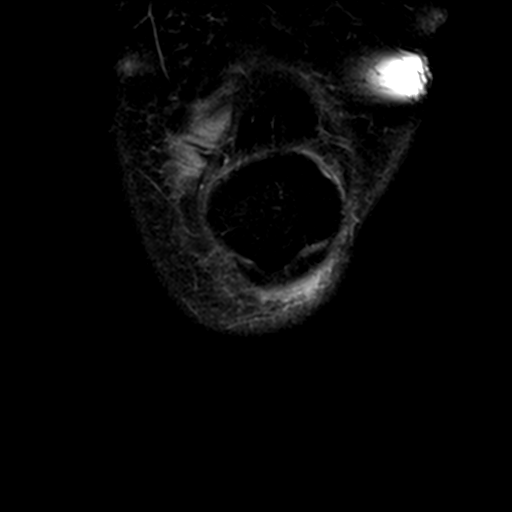
[im 8/27]
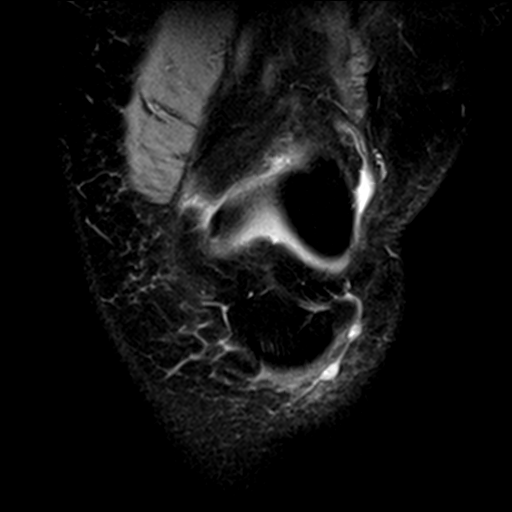
[im 15/27]
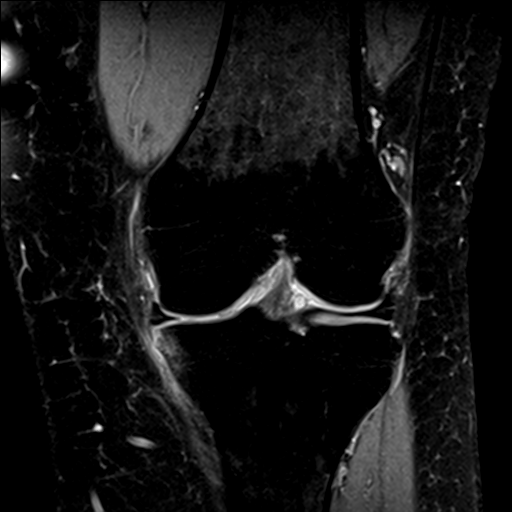
[im 23/27]
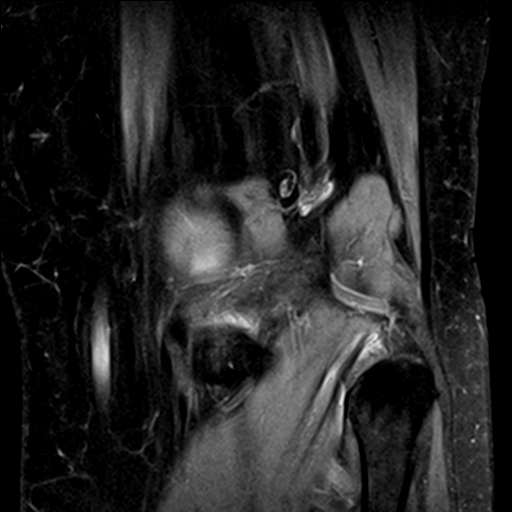

[Series 5: T2 fat-sat · coronal · 3.5mm · 0.31mm/px · 3 of 27 slices shown]
[im 4/27]
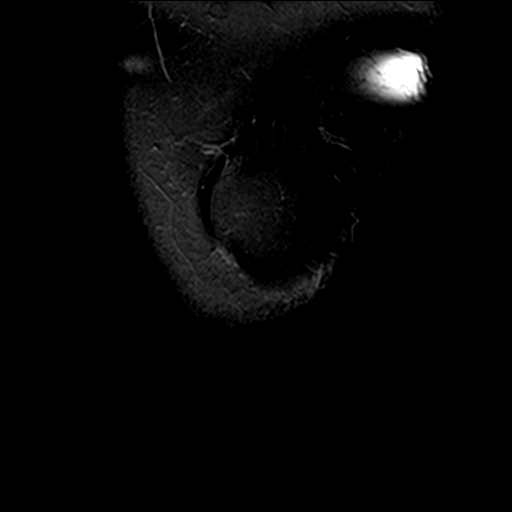
[im 15/27]
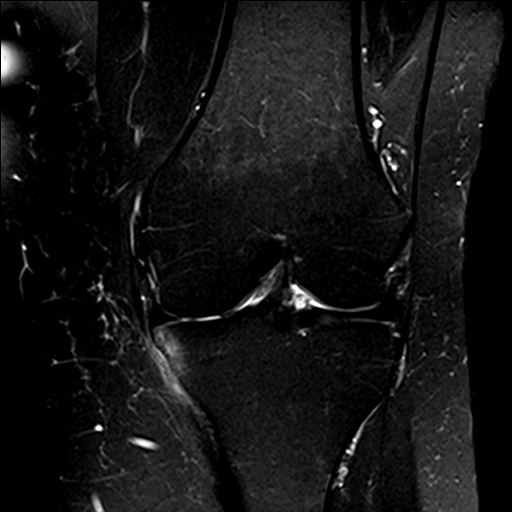
[im 23/27]
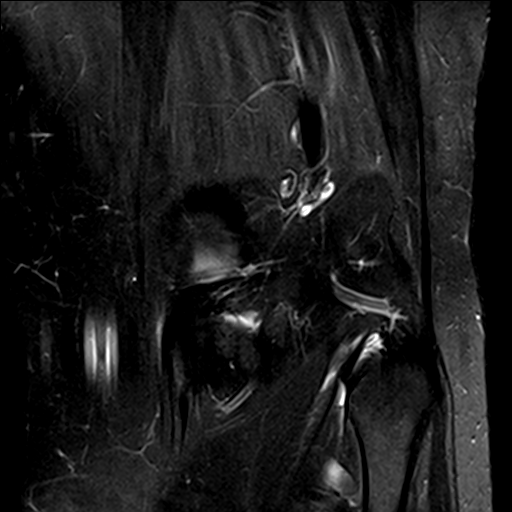

[Series 7: PD fat-sat · sagittal · 3.2mm · 0.29mm/px · 3 of 27 slices shown (3 of 3)]
[im 4/27]
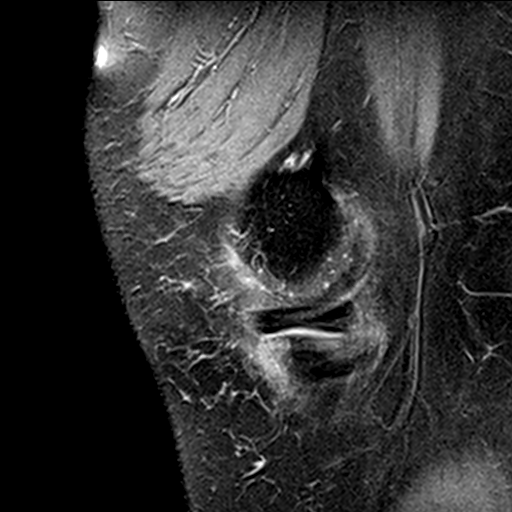
[im 15/27]
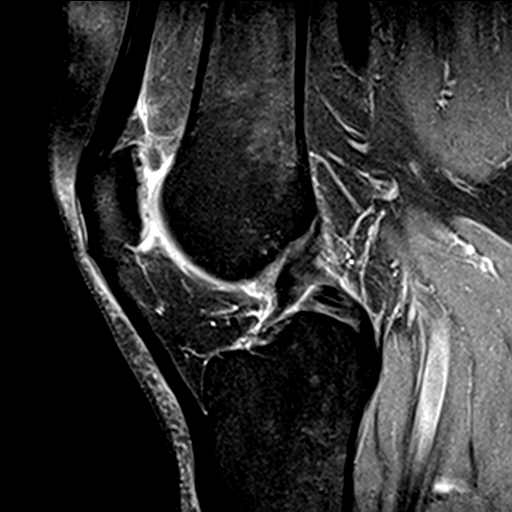
[im 23/27]
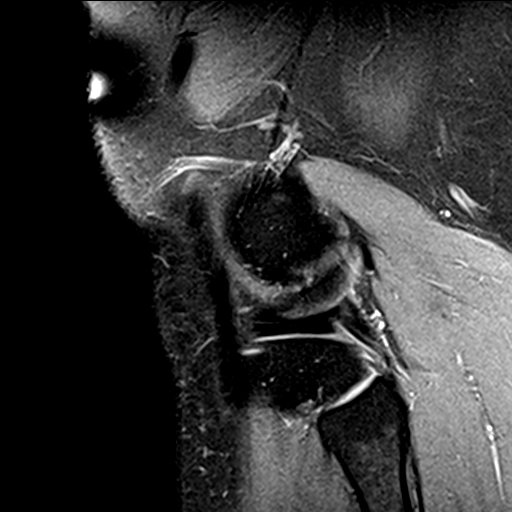

[19 of 40 positions shown; findings below may reference images not displayed]

FINDINGS: MENISCI

Medial meniscus:  Intact.

Lateral meniscus:  Intact.

LIGAMENTS

Cruciates:  Intact ACL and PCL.

Collaterals: Medial collateral ligament is intact. Lateral
collateral ligament complex is intact.

CARTILAGE

Patellofemoral: Grade 2 chondromalacia of the lateral facet and apex
of the patella.

Medial: Multiple areas of full-thickness cartilage loss. Subcortical
edema along the periphery of the medial tibial plateau due to
overlying cartilage loss. Inflammation in the adjacent soft tissues.

Lateral: Small focal area of full-thickness cartilage loss in the
posterior central aspect of the femoral condyle.

Joint:  Normal.

Popliteal Fossa:  Normal.

Extensor Mechanism:  Normal.

Bones:  Normal.

Other: None
IMPRESSION: Tricompartmental chondromalacia, most severe in the medial
compartment particularly along the periphery of the anterior and
medial aspect of the medial tibial plateau with some inflammation of
the adjacent soft tissues at that site.

## 2021-04-11 ENCOUNTER — Telehealth: Payer: Self-pay

## 2021-04-11 NOTE — Telephone Encounter (Signed)
Attempt made to contact YURANI FETTES is a 40 y.o. female re: schedule a new patient appt Dr. Wannetta Sender below.  Pt was not available.  LM on the VM for the patient to call me back.
